# Patient Record
Sex: Female | Born: 2000 | Race: Black or African American | Hispanic: No | Marital: Single | Smoking: Never smoker
Health system: Southern US, Community
[De-identification: ages and names within clinical notes are randomized; demographics above are authoritative.]

---
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
## 2018-01-16 ENCOUNTER — Encounter (HOSPITAL_BASED_OUTPATIENT_CLINIC_OR_DEPARTMENT_OTHER): Payer: Self-pay

## 2018-01-16 ENCOUNTER — Emergency Department (HOSPITAL_BASED_OUTPATIENT_CLINIC_OR_DEPARTMENT_OTHER): Payer: 59

## 2018-01-16 ENCOUNTER — Other Ambulatory Visit: Payer: Self-pay

## 2018-01-16 ENCOUNTER — Emergency Department (HOSPITAL_BASED_OUTPATIENT_CLINIC_OR_DEPARTMENT_OTHER)
Admission: EM | Admit: 2018-01-16 | Discharge: 2018-01-16 | Disposition: A | Discharge status: Home / Self Care | Payer: 59 | Attending: Emergency Medicine | Admitting: Emergency Medicine

## 2018-01-16 DIAGNOSIS — B2790 Infectious mononucleosis, unspecified without complication: Secondary | ICD-10-CM | POA: Diagnosis not present

## 2018-01-16 DIAGNOSIS — R52 Pain, unspecified: Secondary | ICD-10-CM | POA: Diagnosis present

## 2018-01-16 LAB — CBC WITH DIFFERENTIAL/PLATELET
Abs Immature Granulocytes: 0.04 10*3/uL (ref 0.00–0.07)
Basophils Absolute: 0.1 10*3/uL (ref 0.0–0.1)
Basophils Relative: 1 %
EOS PCT: 2 %
Eosinophils Absolute: 0.1 10*3/uL (ref 0.0–0.5)
HEMATOCRIT: 31.7 % — AB (ref 36.0–46.0)
HEMOGLOBIN: 10 g/dL — AB (ref 12.0–15.0)
Immature Granulocytes: 1 %
LYMPHS ABS: 3 10*3/uL (ref 0.7–4.0)
LYMPHS PCT: 40 %
MCH: 27.9 pg (ref 26.0–34.0)
MCHC: 31.5 g/dL (ref 30.0–36.0)
MCV: 88.3 fL (ref 80.0–100.0)
Monocytes Absolute: 0.5 10*3/uL (ref 0.1–1.0)
Monocytes Relative: 7 %
NEUTROS PCT: 49 %
NRBC: 0 % (ref 0.0–0.2)
Neutro Abs: 3.7 10*3/uL (ref 1.7–7.7)
Platelets: 217 10*3/uL (ref 150–400)
RBC: 3.59 MIL/uL — ABNORMAL LOW (ref 3.87–5.11)
RDW: 13.5 % (ref 11.5–15.5)
WBC: 7.4 10*3/uL (ref 4.0–10.5)

## 2018-01-16 LAB — BASIC METABOLIC PANEL
Anion gap: 7 (ref 5–15)
BUN: 10 mg/dL (ref 6–20)
CO2: 23 mmol/L (ref 22–32)
Calcium: 9 mg/dL (ref 8.9–10.3)
Chloride: 105 mmol/L (ref 98–111)
Creatinine, Ser: 0.73 mg/dL (ref 0.44–1.00)
GFR calc Af Amer: >60 mL/min (ref >60)
GFR calc non Af Amer: >60 mL/min (ref >60)
GLUCOSE: 106 mg/dL — AB (ref 70–99)
POTASSIUM: 3.7 mmol/L (ref 3.5–5.1)
Sodium: 135 mmol/L (ref 135–145)

## 2018-01-16 LAB — URINALYSIS, ROUTINE W REFLEX MICROSCOPIC
Bilirubin Urine: NEGATIVE
GLUCOSE, UA: NEGATIVE mg/dL
Ketones, ur: NEGATIVE mg/dL
Nitrite: NEGATIVE
Protein, ur: NEGATIVE mg/dL
pH: 6.5 (ref 5.0–8.0)

## 2018-01-16 LAB — PREGNANCY, URINE: Preg Test, Ur: NEGATIVE

## 2018-01-16 LAB — URINALYSIS, MICROSCOPIC (REFLEX): RBC / HPF: >50 RBC/hpf (ref 0–5)

## 2018-01-16 LAB — TSH: TSH: 0.909 u[IU]/mL (ref 0.350–4.500)

## 2018-01-16 LAB — MONONUCLEOSIS SCREEN: Mono Screen: POSITIVE — AB

## 2018-01-16 NOTE — ED Provider Notes (Signed)
[facility] EMERGENCY DEPARTMENT Provider Note   CSN: [id number] Arrival date & time: 01/16/18  0932     History   Chief Complaint Chief Complaint  Patient presents with  . Generalized Body Aches    HPI Mary Wheeler is a 17 y.o. female.  HPI Patient presents with weakness and fatigue.  Has had over the last week but has been worse over the last around 4 days.  Has had generalized weakness and had a near syncopal episode today.  Will feel hot at times.  No sore throat.  No cough.  No dysuria.  Had URI symptoms around a month ago but that resolved.  No weight loss.  Has had decreased appetite.  No sick contacts.  Went to student health on Monday was told she may have a virus.  Has had episodes where she is felt dizzy in the past but never prolonged fatigue and lightheadedness like this. History reviewed. No pertinent past medical history.  There are no active problems to display for this patient.   History reviewed. No pertinent surgical history.   OB History   None      Home Medications    Prior to Admission medications   Not on File    Family History No family history on file.  Social History Social History   Tobacco Use  . Smoking status: Never Smoker  Substance Use Topics  . Alcohol use: Yes  . Drug use: Not Currently     Allergies   Patient has no known allergies.   Review of Systems Review of Systems  Constitutional: Positive for appetite change and fatigue. Negative for fever.  HENT: Negative for congestion.   Respiratory: Negative for shortness of breath.   Cardiovascular: Negative for chest pain.  Gastrointestinal: Negative for abdominal pain.  Genitourinary: Negative for flank pain.  Musculoskeletal: Negative for back pain.  Skin: Negative for wound.  Neurological: Positive for weakness.  Hematological: Negative for adenopathy.  Psychiatric/Behavioral: Negative for confusion.     Physical Exam Updated Vital Signs BP  118/75 (BP Location: Left Arm)   Pulse 80   Temp 99.4 F (37.4 C) (Oral)   Resp 18   Ht 5\' 5"  (1.651 m)   Wt 76.7 kg   LMP 01/13/2018   SpO2 100%   BMI 28.12 kg/m   Physical Exam  Constitutional: She appears well-developed.  HENT:  Head: Atraumatic.  Mouth/Throat: No oropharyngeal exudate.  Eyes: Pupils are equal, round, and reactive to light.  Neck: Neck supple. No thyromegaly present.  Cardiovascular: Normal rate.  Pulmonary/Chest: She has no wheezes. She has no rales.  Abdominal: There is no tenderness.  Slight lower abdominal fullness.  Musculoskeletal: She exhibits no edema.  Neurological: She is alert.  Skin: Skin is warm.     ED Treatments / Results  Labs (all labs ordered are listed, but only abnormal results are displayed) Labs Reviewed  CBC WITH DIFFERENTIAL/PLATELET - Abnormal; Notable for the following components:      Result Value   RBC 3.59 (*)    Hemoglobin 10.0 (*)    HCT 31.7 (*)    All other components within normal limits  URINALYSIS, ROUTINE W REFLEX MICROSCOPIC - Abnormal; Notable for the following components:   Color, Urine RED (*)    APPearance CLOUDY (*)    Specific Gravity, Urine <1.005 (*)    Hgb urine dipstick LARGE (*)    Leukocytes, UA TRACE (*)    All other components within normal limits  BASIC METABOLIC PANEL - Abnormal; Notable for the following components:   Glucose, Bld 106 (*)    All other components within normal limits  MONONUCLEOSIS SCREEN - Abnormal; Notable for the following components:   Mono Screen POSITIVE (*)    All other components within normal limits  URINALYSIS, MICROSCOPIC (REFLEX) - Abnormal; Notable for the following components:   Bacteria, UA RARE (*)    All other components within normal limits  PREGNANCY, URINE  TSH  PATHOLOGIST SMEAR REVIEW    EKG EKG Interpretation  Date/Time:  Wednesday January 16 2018 09:34:36 EST Ventricular Rate:  85 PR Interval:    QRS Duration: 97 QT Interval:  353 QTC  Calculation: 420 R Axis:   -12 Text Interpretation:  Sinus rhythm Confirmed by Davonna Belling [phone]) on 01/16/2018 9:59:38 AM   Radiology Dg Chest 2 View  Result Date: 01/16/2018 CLINICAL DATA:  Weakness, shortness of breath EXAM: CHEST - 2 VIEW COMPARISON:  None. FINDINGS: Heart and mediastinal contours are within normal limits. No focal opacities or effusions. No acute bony abnormality. IMPRESSION: No active cardiopulmonary disease. Electronically Signed   By: Rolm Baptise M.D.   On: 01/16/2018 09:23    Procedures Procedures (including critical care time)  Medications Ordered in ED Medications - No data to display   Initial Impression / Assessment and Plan / ED Course  I have reviewed the triage vital signs and the nursing notes.  Pertinent labs & imaging results that were available during my care of the patient were reviewed by me and considered in my medical decision making (see chart for details).     Patient has fatigue.  Has had some chills.  Mild anemia.  Also has a positive mononucleosis screen.  No left upper quadrant tenderness or swelling.  TSH pending.  Will discharge home.  Final Clinical Impressions(s) / ED Diagnoses   Final diagnoses:  Infectious mononucleosis without complication, infectious mononucleosis due to unspecified organism    ED Discharge Orders    None       Davonna Belling, MD 01/16/18 1124

## 2018-01-16 NOTE — ED Triage Notes (Signed)
Pt states aching all over since Saturday feeling hot

## 2018-01-16 NOTE — ED Notes (Signed)
Pt states not feeling well since Saturday, hot and cold feeling with body aches. States has not tried OTC meds

## 2018-01-18 LAB — PATHOLOGIST SMEAR REVIEW

## 2018-01-23 ENCOUNTER — Emergency Department (HOSPITAL_BASED_OUTPATIENT_CLINIC_OR_DEPARTMENT_OTHER)
Admission: EM | Admit: 2018-01-23 | Discharge: 2018-01-23 | Disposition: A | Discharge status: Home / Self Care | Payer: 59 | Attending: Emergency Medicine | Admitting: Emergency Medicine

## 2018-01-23 ENCOUNTER — Encounter (HOSPITAL_BASED_OUTPATIENT_CLINIC_OR_DEPARTMENT_OTHER): Payer: Self-pay

## 2018-01-23 ENCOUNTER — Other Ambulatory Visit: Payer: Self-pay

## 2018-01-23 ENCOUNTER — Emergency Department (HOSPITAL_BASED_OUTPATIENT_CLINIC_OR_DEPARTMENT_OTHER): Payer: 59

## 2018-01-23 DIAGNOSIS — J029 Acute pharyngitis, unspecified: Secondary | ICD-10-CM | POA: Diagnosis present

## 2018-01-23 DIAGNOSIS — J36 Peritonsillar abscess: Secondary | ICD-10-CM | POA: Diagnosis not present

## 2018-01-23 DIAGNOSIS — D649 Anemia, unspecified: Secondary | ICD-10-CM | POA: Insufficient documentation

## 2018-01-23 DIAGNOSIS — B2790 Infectious mononucleosis, unspecified without complication: Secondary | ICD-10-CM | POA: Insufficient documentation

## 2018-01-23 LAB — CBC WITH DIFFERENTIAL/PLATELET
Abs Immature Granulocytes: 0.12 10*3/uL — ABNORMAL HIGH (ref 0.00–0.07)
BASOS ABS: 0.1 10*3/uL (ref 0.0–0.1)
BASOS PCT: 1 %
Eosinophils Absolute: 0 10*3/uL (ref 0.0–0.5)
Eosinophils Relative: 0 %
HCT: 31.6 % — ABNORMAL LOW (ref 36.0–46.0)
Hemoglobin: 9.7 g/dL — ABNORMAL LOW (ref 12.0–15.0)
IMMATURE GRANULOCYTES: 1 %
Lymphocytes Relative: 49 %
Lymphs Abs: 9.4 10*3/uL — ABNORMAL HIGH (ref 0.7–4.0)
MCH: 26.9 pg (ref 26.0–34.0)
MCHC: 30.7 g/dL (ref 30.0–36.0)
MCV: 87.5 fL (ref 80.0–100.0)
MONOS PCT: 17 %
Monocytes Absolute: 3.3 10*3/uL — ABNORMAL HIGH (ref 0.1–1.0)
NEUTROS ABS: 6.1 10*3/uL (ref 1.7–7.7)
NEUTROS PCT: 32 %
PLATELETS: 334 10*3/uL (ref 150–400)
RBC: 3.61 MIL/uL — ABNORMAL LOW (ref 3.87–5.11)
RDW: 14.5 % (ref 11.5–15.5)
WBC MORPHOLOGY: REACTIVE
WBC: 19 10*3/uL — ABNORMAL HIGH (ref 4.0–10.5)
nRBC: 0 % (ref 0.0–0.2)

## 2018-01-23 LAB — BASIC METABOLIC PANEL
ANION GAP: 12 (ref 5–15)
BUN: 11 mg/dL (ref 6–20)
CO2: 23 mmol/L (ref 22–32)
Calcium: 8.6 mg/dL — ABNORMAL LOW (ref 8.9–10.3)
Chloride: 95 mmol/L — ABNORMAL LOW (ref 98–111)
Creatinine, Ser: 0.77 mg/dL (ref 0.44–1.00)
GFR calc Af Amer: >60 mL/min (ref >60)
GLUCOSE: 98 mg/dL (ref 70–99)
POTASSIUM: 3.8 mmol/L (ref 3.5–5.1)
Sodium: 130 mmol/L — ABNORMAL LOW (ref 135–145)

## 2018-01-23 LAB — GROUP A STREP BY PCR: GROUP A STREP BY PCR: NOT DETECTED

## 2018-01-23 MED ORDER — SODIUM CHLORIDE 0.9 % IV BOLUS
1000.0000 mL | Freq: Once | INTRAVENOUS | Status: AC
  Administered 2018-01-23: 1000 mL via INTRAVENOUS

## 2018-01-23 MED ORDER — IBUPROFEN 100 MG/5ML PO SUSP
600.0000 mg | Freq: Once | ORAL | Status: AC
  Administered 2018-01-23: 600 mg via ORAL
  Filled 2018-01-23: qty 30

## 2018-01-23 MED ORDER — PREDNISOLONE 15 MG/5ML PO SOLN: 60.0000 mg | Freq: Every day | ORAL | 0 refills | Status: AC

## 2018-01-23 MED ORDER — CLINDAMYCIN PALMITATE HCL 75 MG/5ML PO SOLR: 300.0000 mg | Freq: Three times a day (TID) | ORAL | 0 refills | Status: AC

## 2018-01-23 MED ORDER — ACETAMINOPHEN 160 MG/5ML PO SOLN
500.0000 mg | Freq: Once | ORAL | Status: AC
  Administered 2018-01-23: 500 mg via ORAL
  Filled 2018-01-23: qty 20.3

## 2018-01-23 MED ORDER — ACETAMINOPHEN 500 MG PO TABS
1000.0000 mg | ORAL_TABLET | Freq: Once | ORAL | Status: DC
  Filled 2018-01-23: qty 2

## 2018-01-23 MED ORDER — CLINDAMYCIN PHOSPHATE 900 MG/50ML IV SOLN
900.0000 mg | Freq: Once | INTRAVENOUS | Status: AC
  Administered 2018-01-23: 900 mg via INTRAVENOUS
  Filled 2018-01-23: qty 50

## 2018-01-23 MED ORDER — DEXAMETHASONE SODIUM PHOSPHATE 10 MG/ML IJ SOLN
10.0000 mg | Freq: Once | INTRAMUSCULAR | Status: AC
  Administered 2018-01-23: 10 mg via INTRAVENOUS
  Filled 2018-01-23: qty 1

## 2018-01-23 MED ORDER — IOPAMIDOL (ISOVUE-300) INJECTION 61%
100.0000 mL | Freq: Once | INTRAVENOUS | Status: AC | PRN
  Administered 2018-01-23: 75 mL via INTRAVENOUS

## 2018-01-23 NOTE — ED Provider Notes (Signed)
[facility] Provider Note   CSN: [id number] Arrival date & time: 01/23/18  1518     History   Chief Complaint Sore throat  HPI Mary Wheeler is a 17 y.o. female without significant past medical hx who presents to the ED with complaints of sore throat x 3 days. Patient states she developed sore throat with associated bilateral tonsillar 3 days prior and it has been progressively worsening. Current pain is a 10/10 in severity, it is worse with swallowing and she feels she is not able to swallow anything, she prefers to spit out her saliva. She has not taken medicines for this for this reason. She has had congestion and cough as well. States with all of her symptoms she feels short of breath. She was seen in the ED 11/27 for generalized weakness and fatigue, labs w/ anemia w/ hgb/hct 10.0/31.7. Mononucleosis test positive- was not having sore throat at that time. States since last visit low grade temps at home in the 99 range, today spiked above 100. Patient denies N/V/D, chest pain, or abdominal pain.   HPI  History reviewed. No pertinent past medical history.  There are no active problems to display for this patient.   History reviewed. No pertinent surgical history.   OB History   None      Home Medications    Prior to Admission medications   Not on File    Family History No family history on file.  Social History Social History   Tobacco Use  . Smoking status: Never Smoker  . Smokeless tobacco: Never Used  Substance Use Topics  . Alcohol use: Yes    Comment: occ  . Drug use: Not Currently     Allergies   Patient has no known allergies.   Review of Systems Review of Systems  Constitutional: Positive for chills and fever.  HENT: Positive for congestion, rhinorrhea, sore throat and trouble swallowing. Negative for ear pain.   Respiratory: Positive for cough and shortness of breath.   Cardiovascular: Negative for chest pain.    Gastrointestinal: Negative for abdominal pain and vomiting.  All other systems reviewed and are negative.    Physical Exam Updated Vital Signs BP 120/81 (BP Location: Left Arm)   Pulse (!) 117   Temp (!) 101.8 F (38.8 C) (Oral)   Resp (!) 28   Ht 5\' 5"  (1.651 m)   Wt 74.4 kg   LMP 01/13/2018   SpO2 100%   BMI 27.29 kg/m   Physical Exam  Constitutional: She appears well-developed and well-nourished.  Non-toxic appearance. No distress.  HENT:  Head: Normocephalic and atraumatic.  Right Ear: Tympanic membrane and ear canal normal. Tympanic membrane is not perforated, not erythematous, not retracted and not bulging.  Left Ear: Tympanic membrane and ear canal normal. Tympanic membrane is not perforated, not erythematous, not retracted and not bulging.  Nose: Mucosal edema present.  Mouth/Throat: Uvula is midline. Oropharyngeal exudate and posterior oropharyngeal erythema present. Tonsils are 4+ on the right. Tonsils are 4+ on the left. Tonsillar exudate.  Patient avoiding swallowing her own secretions, spitting into bag at bedside. Voice may be very mildly muffled, but no hot potato voice. No trismus. No drooling. Submandibular space is soft.   Eyes: Pupils are equal, round, and reactive to light. Conjunctivae and EOM are normal. Right eye exhibits no discharge. Left eye exhibits no discharge.  Neck: Normal range of motion. Neck supple. No neck rigidity.  Cardiovascular: Regular rhythm. Tachycardia present.  No murmur heard. Pulmonary/Chest: Breath sounds normal. No accessory muscle usage or stridor. Tachypnea noted. No respiratory distress. She has no wheezes. She has no rhonchi. She has no rales.  Abdominal: Soft. She exhibits no distension and no mass. There is no splenomegaly or hepatomegaly. There is no tenderness. There is no rebound and no guarding.  Lymphadenopathy:    She has cervical adenopathy.  Neurological: She is alert.  Skin: Skin is warm and dry. No rash noted.   Psychiatric: She has a normal mood and affect. Her behavior is normal.  Nursing note and vitals reviewed.    ED Treatments / Results  Labs (all labs ordered are listed, but only abnormal results are displayed) Labs Reviewed  BASIC METABOLIC PANEL - Abnormal; Notable for the following components:      Result Value   Sodium 130 (*)    Chloride 95 (*)    Calcium 8.6 (*)    All other components within normal limits  CBC WITH DIFFERENTIAL/PLATELET - Abnormal; Notable for the following components:   WBC 19.0 (*)    RBC 3.61 (*)    Hemoglobin 9.7 (*)    HCT 31.6 (*)    Lymphs Abs 9.4 (*)    Monocytes Absolute 3.3 (*)    Abs Immature Granulocytes 0.12 (*)    All other components within normal limits  GROUP A STREP BY PCR  PATHOLOGIST SMEAR REVIEW    EKG None  Radiology Ct Soft Tissue Neck W Contrast  Result Date: 01/23/2018 CLINICAL DATA:  Shortness of breath, swollen tonsils, diagnosed with mono. EXAM: CT NECK WITH CONTRAST TECHNIQUE: Multidetector CT imaging of the neck was performed using the standard protocol following the bolus administration of intravenous contrast. CONTRAST:  42mL ISOVUE-300 IOPAMIDOL (ISOVUE-300) INJECTION 61% COMPARISON:  None. FINDINGS: Pharynx and larynx: Severe nasopharyngeal adenoidal inflammation, striated enhancement pattern. Severe BILATERAL tonsillitis, RIGHT greater than LEFT. 5 mm RIGHT tonsillar microabscess. Inflammation extends into the peritonsillar soft tissues, RIGHT greater than LEFT, without frank peritonsillar abscess. Swollen uvula. Normal epiglottis. Normal larynx. Salivary glands: No inflammation, mass, or stone. Thyroid: Normal. Lymph nodes: Reactive cervical adenopathy bilaterally. Vascular: Patent Limited intracranial: Negative Visualized orbits: Negative Mastoids and visualized paranasal sinuses: Negative Skeleton: Unremarkable. Upper chest: No abnormality. Other: None. IMPRESSION: Severe palatine tonsillar enlargement, small RIGHT  tonsillar abscess, developing RIGHT greater than LEFT peritonsillar inflammation, and severe nasopharyngeal adenoidal inflammation. In conjunction with uvular enlargement, there is some narrowing of the upper airway. Reactive cervical lymphadenopathy. Consider ENT consultation. These results were called by telephone at the time of interpretation on 01/23/2018 at 4:44 pm to [facility] , who verbally acknowledged these results. Electronically Signed   By: Staci Righter M.D.   On: 01/23/2018 16:49    Procedures Procedures (including critical care time)  Medications Ordered in ED Medications - No data to display   Initial Impression / Assessment and Plan / ED Course  I have reviewed the triage vital signs and the nursing notes.  Pertinent labs & imaging results that were available during my care of the patient were reviewed by me and considered in my medical decision making (see chart for details).   Patient with confirmed mononucleosis at last ED visit presents with progressively worsening sore throat with tonsillar swelling over the past 3 days.  Patient states this has led to feeling as though she cannot swallow with some difficulty breathing.  Febrile upon ER arrival with likely resultant tachycardia and tachypnea.  Exam with significant tonsillar swelling, tonsils are  touching, uvula appears enlarged, there are exudates bilaterally.  She is spitting her saliva into a bag and her voice is mildly muffled but not necessarily hot potato voice.  She is not stridorous. Lungs are clear to auscultation bilaterally. No palpable hepatosplenomegaly. Basic labs obtained, patient has leukocytosis at 19.0, elevated lymphocytes, consistent with mononucleosis. Anemia present, similar to prior- discuss need for PCP recheck. She has mildly low sodium and chloride she is receiving fluids in the emergency department.  Strep test is negative.  CT soft tissue neck has severe palatine tonsillar enlargement with  small right tonsillar abscess.  There is developing peritonsillar inflammation as well as severe nasopharyngeal adenoidal inflammation.  Some narrowing of the upper airway present.  Radiologist Dr. Jola Baptist discussed results with me via telephone and recommends ENT consultation.  17:06: CONSULT: Discussed case with Dr. Radene Journey, ENT, recommends IV decadron and 900 mg of IV clindamycin in the ED. Discharge home with steroids as well as clindamycin (300 TID) or augmentin (875 BID), each of which should be oral suspensions. Can follow up in office tomorrow afternoon.   19:30: RE-EVAL: Patient feeling much better after ER intervention. Her vitals have normalized. She is tolerating PO. Feel she is safe for discharge at this time with close follow up & strict return precautions. I discussed results, treatment plan, need for follow-up, and return precautions with the patient and her mother. Provided opportunity for questions, patient and her mother confirmed understanding and are in agreement with plan.   Vitals:   01/23/18 1900 01/23/18 1930  BP: 111/69 109/69  Pulse: 99 87  Resp:  16  Temp:  98.1 F (36.7 C)  SpO2: 99% 97%    Findings and plan of care discussed with supervising physician Dr. Laverta Baltimore who personally evaluated and examined this patient and is in agreement.   Final Clinical Impressions(s) / ED Diagnoses   Final diagnoses:  Acute pharyngitis due to infectious mononucleosis  Anemia, unspecified type  Tonsillar abscess    ED Discharge Orders         Ordered    clindamycin (CLEOCIN) 75 MG/5ML solution  3 times daily     01/23/18 1923    prednisoLONE (PRELONE) 15 MG/5ML SOLN  Daily before breakfast     01/23/18 1923           Leafy Kindle 01/23/18 1949    Margette Fast, MD 01/24/18 1131

## 2018-01-23 NOTE — Discharge Instructions (Signed)
You were seen in the emergency department today for sore throat and trouble breathing.  Your CT scan showed that you have severe tonsillitis and swelling throughout your lymph nodes.  We are sending you home with steroids to help with inflammation and pain as well as clindamycin, and antibiotic to help with the infection.  Please take these as prescribed.  Please continue to take Motrin/Tylenol per over-the-counter dosing for any fevers.  We have prescribed you new medication(s) today. Discuss the medications prescribed today with your pharmacist as they can have adverse effects and interactions with your other medicines including over the counter and prescribed medications. Seek medical evaluation if you start to experience new or abnormal symptoms after taking one of these medicines, seek care immediately if you start to experience difficulty breathing, feeling of your throat closing, facial swelling, or rash as these could be indications of a more serious allergic reaction  We would like you to follow-up closely with the ear nose and throat doctor, Dr. Lucia Gaskins, provided in your discharge instructions.  Please call tomorrow morning to set up an appointment for tomorrow afternoon.  Return to the ER at any time should you experience new or worsening symptoms including but not limited to inability to swallow, change in your voice, difficulty breathing, worsened pain or any other concerns.

## 2018-01-23 NOTE — ED Triage Notes (Signed)
Pt c/o SOB, swollen tonsils-was dx with mono last week-NAD-steady gait

## 2018-01-24 LAB — PATHOLOGIST SMEAR REVIEW: Path Review: REACTIVE

## 2019-02-18 ENCOUNTER — Ambulatory Visit: Payer: Self-pay | Attending: Internal Medicine

## 2019-02-19 ENCOUNTER — Ambulatory Visit: Payer: Self-pay | Attending: Internal Medicine

## 2019-02-19 DIAGNOSIS — Z20828 Contact with and (suspected) exposure to other viral communicable diseases: Secondary | ICD-10-CM | POA: Insufficient documentation

## 2019-02-19 DIAGNOSIS — Z20822 Contact with and (suspected) exposure to covid-19: Secondary | ICD-10-CM

## 2019-02-20 LAB — NOVEL CORONAVIRUS, NAA: SARS-CoV-2, NAA: NOT DETECTED

## 2019-04-28 ENCOUNTER — Ambulatory Visit: Payer: Self-pay | Attending: Internal Medicine

## 2019-04-28 DIAGNOSIS — Z23 Encounter for immunization: Secondary | ICD-10-CM | POA: Insufficient documentation

## 2019-04-28 NOTE — Progress Notes (Signed)
   Covid-19 Vaccination Clinic  Name:  Mary Wheeler    MRN: [id number] DOB: 18-Jan-2001  04/28/2019  [name] was observed post Covid-19 immunization for 15 minutes without incident. She was provided with Vaccine Information Sheet and instruction to access the V-Safe system.   [name] was instructed to call 911 with any severe reactions post vaccine: Marland Kitchen Difficulty breathing  . Swelling of face and throat  . A fast heartbeat  . A bad rash all over body  . Dizziness and weakness   Immunizations Administered    Name Date Dose VIS Date Route   Pfizer COVID-19 Vaccine 04/28/2019  9:10 AM 0.3 mL 01/31/2019 Intramuscular   Manufacturer: [place]   Lot: [id number]   [place]: [id number]

## 2019-05-28 ENCOUNTER — Ambulatory Visit: Payer: Self-pay | Attending: Internal Medicine

## 2019-05-28 DIAGNOSIS — Z23 Encounter for immunization: Secondary | ICD-10-CM

## 2019-05-28 NOTE — Progress Notes (Signed)
   Covid-19 Vaccination Clinic  Name:  Mary Wheeler    MRN: [id number] DOB: May 20, 2000  05/28/2019  Ms. Current was observed post Covid-19 immunization for 15 minutes without incident. She was provided with Vaccine Information Sheet and instruction to access the V-Safe system.   [name] was instructed to call 911 with any severe reactions post vaccine: Marland Kitchen Difficulty breathing  . Swelling of face and throat  . A fast heartbeat  . A bad rash all over body  . Dizziness and weakness   Immunizations Administered    Name Date Dose VIS Date Route   Pfizer COVID-19 Vaccine 05/28/2019 10:42 AM 0.3 mL 01/31/2019 Intramuscular   Manufacturer: [employer]   Lot: [id number]   [place]: [id number]

## 2019-12-26 ENCOUNTER — Other Ambulatory Visit: Payer: Self-pay

## 2019-12-26 DIAGNOSIS — Z20822 Contact with and (suspected) exposure to covid-19: Secondary | ICD-10-CM

## 2019-12-27 LAB — SARS-COV-2, NAA 2 DAY TAT

## 2019-12-27 LAB — NOVEL CORONAVIRUS, NAA: SARS-CoV-2, NAA: NOT DETECTED

## 2020-02-27 ENCOUNTER — Ambulatory Visit: Payer: Self-pay | Attending: Internal Medicine

## 2020-02-27 DIAGNOSIS — Z23 Encounter for immunization: Secondary | ICD-10-CM

## 2020-02-27 NOTE — Progress Notes (Signed)
   Covid-19 Vaccination Clinic  Name:  Mary Wheeler    MRN: [id number] DOB: 08-11-2000  02/27/2020  [name] was observed post Covid-19 immunization for 15 minutes without incident. She was provided with Vaccine Information Sheet and instruction to access the V-Safe system.   [name] was instructed to call 911 with any severe reactions post vaccine: Marland Kitchen Difficulty breathing  . Swelling of face and throat  . A fast heartbeat  . A bad rash all over body  . Dizziness and weakness   Immunizations Administered    Name Date Dose VIS Date Route   Pfizer COVID-19 Vaccine 02/27/2020  1:50 PM 0.3 mL 12/10/2019 Intramuscular   Manufacturer: [place]   Lot: [id number]   [place]: [id number]

## 2020-09-14 IMAGING — CT CT NECK W/ CM
3 of 5 series · 12 of 33 positions shown, 14 images · IV contrast (iopamidol)
Comparison: None.

CLINICAL DATA: Shortness of breath, swollen tonsils, diagnosed with
mono.

EXAM:
CT NECK WITH CONTRAST
TECHNIQUE: Multidetector CT imaging of the neck was performed using the
standard protocol following the bolus administration of intravenous
contrast.
CONTRAST:  75mL PQ2BBR-JPP IOPAMIDOL (PQ2BBR-JPP) INJECTION 61%

[Series 7: sag neck · sagittal · 0.45mm/px · 5 of 117 slices shown, 6 images]
[im 39/117  bone]
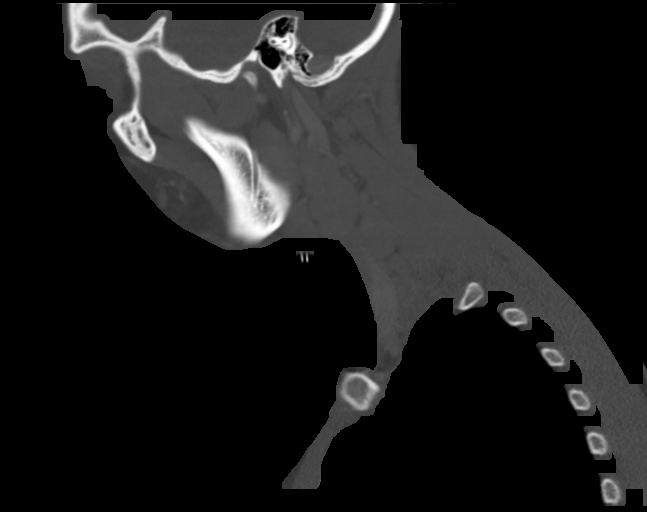
[im 49/117  bone]
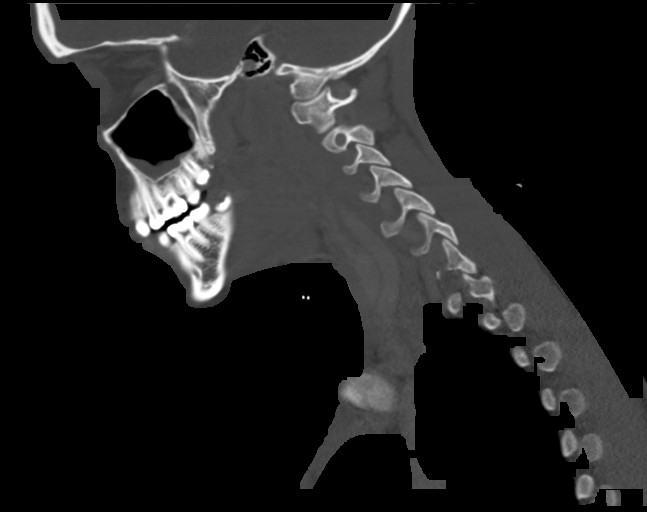
[im 59/117  soft-tissue]
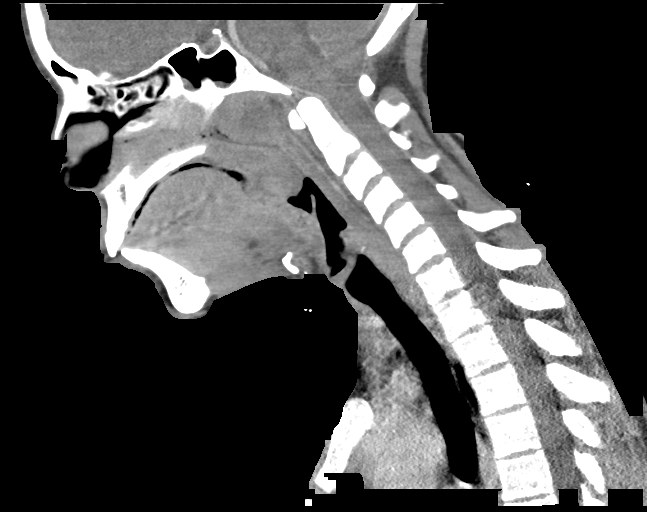
[im 59/117  bone]
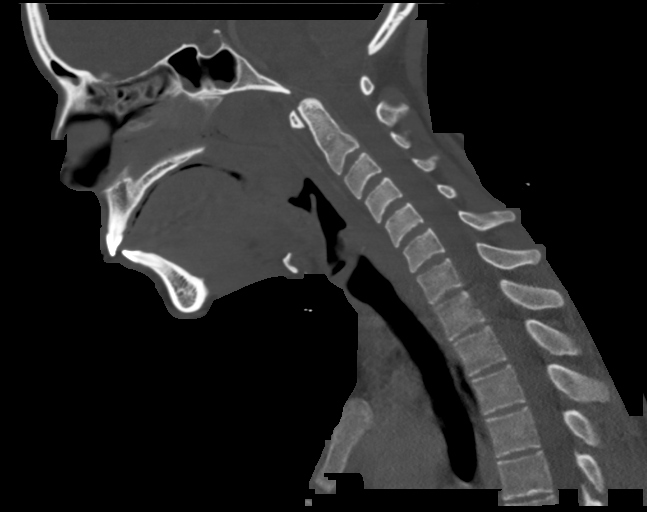
[im 68/117  bone]
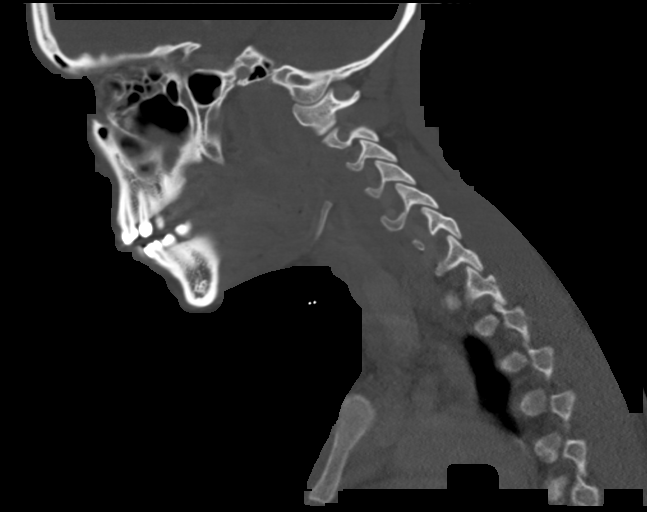
[im 78/117  bone]
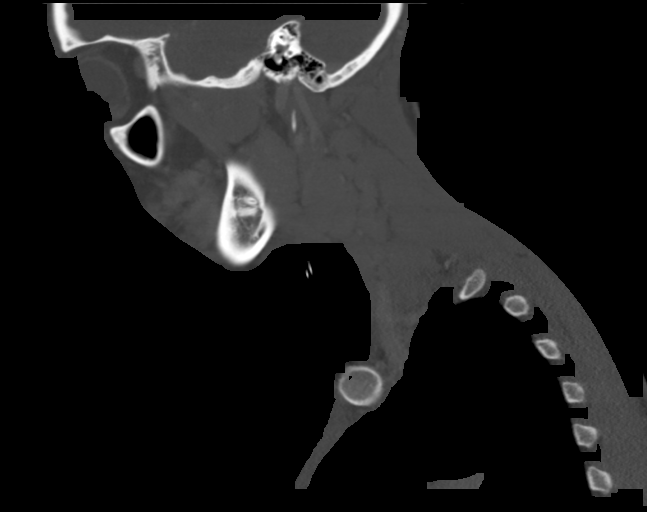

[Series 8: cor neck · coronal · 0.49mm/px · 3 of 122 slices shown]
[im 27/122  bone]
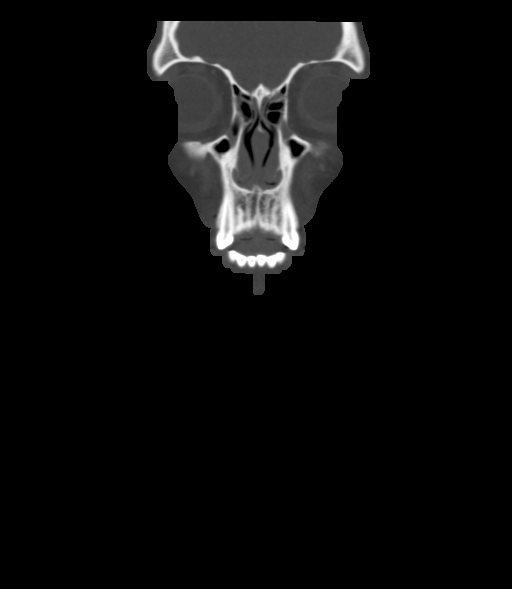
[im 50/122  bone]
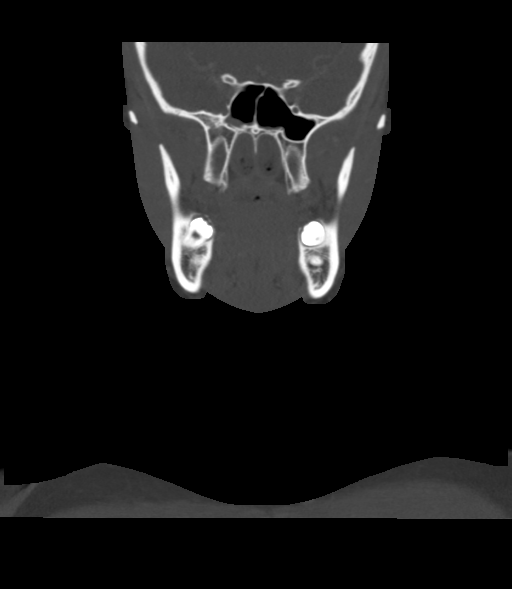
[im 72/122  bone]
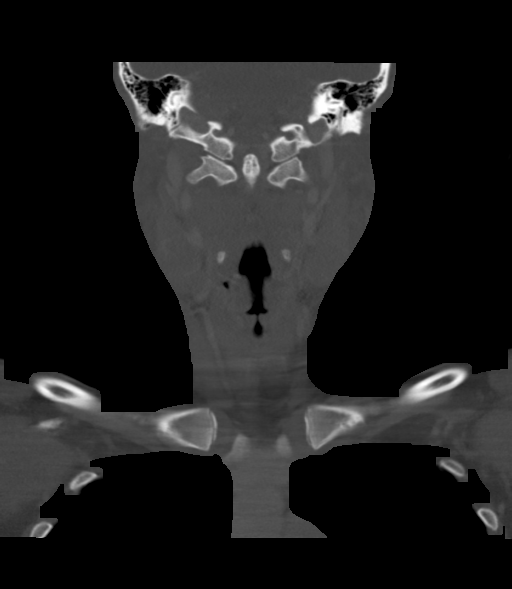

[Series 9: orthogonal ax · axial · 0.39mm/px · z∈[+753,+939]mm · 4 of 146 slices shown, 5 images]
[im 25/146  soft-tissue]
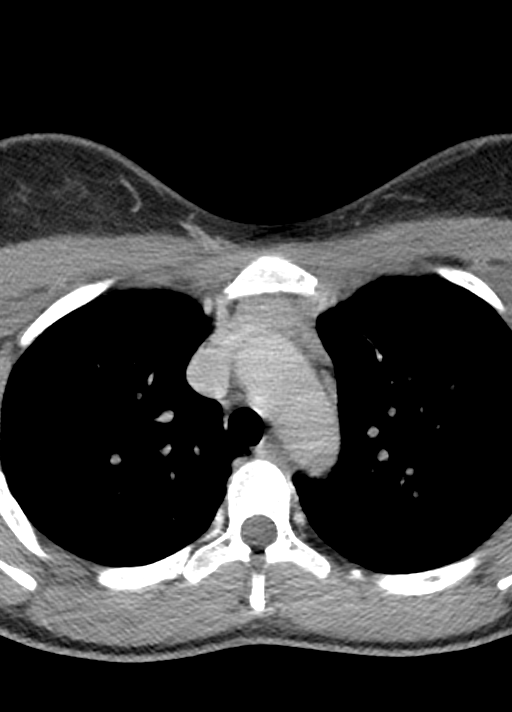
[im 25/146  bone]
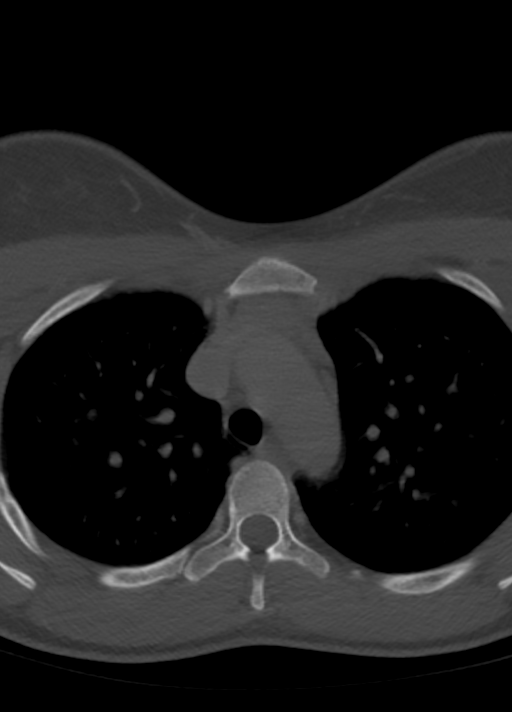
[im 49/146  bone]
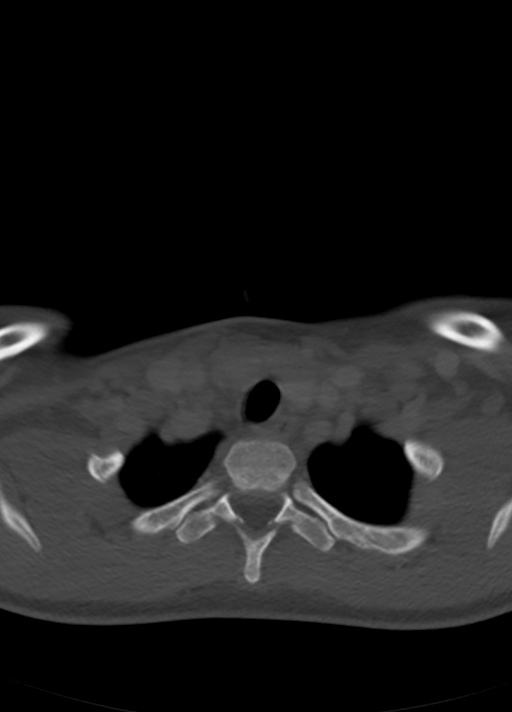
[im 97/146  bone]
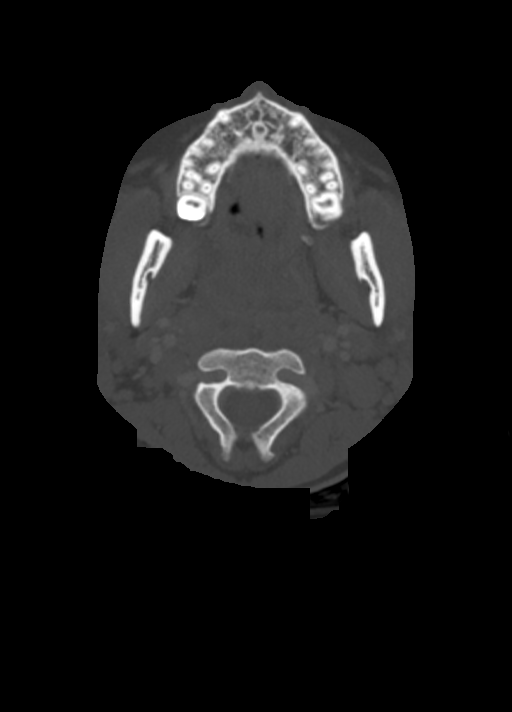
[im 121/146  bone]
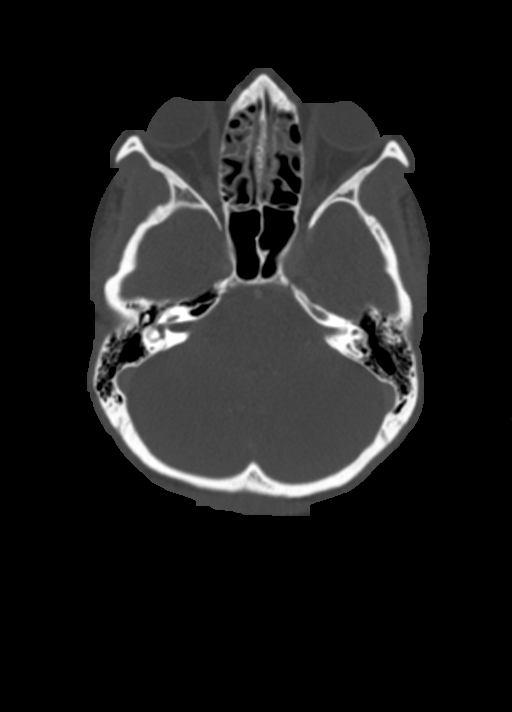

[12 of 33 positions shown; findings below may reference images not displayed]

FINDINGS: Pharynx and larynx: Severe nasopharyngeal adenoidal inflammation,
striated enhancement pattern. Severe BILATERAL tonsillitis, RIGHT
greater than LEFT. 5 mm RIGHT tonsillar microabscess. Inflammation
extends into the peritonsillar soft tissues, RIGHT greater than
LEFT, without frank peritonsillar abscess. Swollen uvula. Normal
epiglottis. Normal larynx.

Salivary glands: No inflammation, mass, or stone.

Thyroid: Normal.

Lymph nodes: Reactive cervical adenopathy bilaterally.

Vascular: Patent

Limited intracranial: Negative

Visualized orbits: Negative

Mastoids and visualized paranasal sinuses: Negative

Skeleton: Unremarkable.

Upper chest: No abnormality.

Other: None.
IMPRESSION: Severe palatine tonsillar enlargement, small RIGHT tonsillar
abscess, developing RIGHT greater than LEFT peritonsillar
inflammation, and severe nasopharyngeal adenoidal inflammation. In
conjunction with uvular enlargement, there is some narrowing of the
upper airway. Reactive cervical lymphadenopathy. Consider ENT
consultation.

These results were called by telephone at the time of interpretation
on 01/23/2018 at [DATE] to LUZMILA MADERA , who verbally
acknowledged these results.

## 2022-10-06 ENCOUNTER — Encounter (HOSPITAL_COMMUNITY): Payer: Self-pay

## 2022-10-06 ENCOUNTER — Emergency Department (HOSPITAL_COMMUNITY)
Admission: EM | Admit: 2022-10-06 | Discharge: 2022-10-06 | Discharge status: Against Medical Advice | Payer: Self-pay | Source: Home / Self Care

## 2022-10-06 DIAGNOSIS — Y92410 Unspecified street and highway as the place of occurrence of the external cause: Secondary | ICD-10-CM | POA: Insufficient documentation

## 2022-10-06 DIAGNOSIS — R519 Headache, unspecified: Secondary | ICD-10-CM | POA: Insufficient documentation

## 2022-10-06 DIAGNOSIS — Z5321 Procedure and treatment not carried out due to patient leaving prior to being seen by health care provider: Secondary | ICD-10-CM | POA: Insufficient documentation
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
## 2022-10-06 NOTE — ED Notes (Signed)
Pt stated she had to leave, was unable to wait.

## 2022-10-07 ENCOUNTER — Encounter (HOSPITAL_COMMUNITY): Payer: Self-pay

## 2022-10-07 ENCOUNTER — Emergency Department (HOSPITAL_COMMUNITY)
Admission: EM | Admit: 2022-10-07 | Discharge: 2022-10-07 | Disposition: A | Discharge status: Home / Self Care | Payer: BC Managed Care – PPO | Source: Home / Self Care | Attending: Emergency Medicine | Admitting: Emergency Medicine

## 2022-10-07 ENCOUNTER — Emergency Department (HOSPITAL_COMMUNITY): Payer: BC Managed Care – PPO

## 2022-10-07 DIAGNOSIS — Y92410 Unspecified street and highway as the place of occurrence of the external cause: Secondary | ICD-10-CM | POA: Insufficient documentation

## 2022-10-07 DIAGNOSIS — R112 Nausea with vomiting, unspecified: Secondary | ICD-10-CM | POA: Diagnosis not present

## 2022-10-07 DIAGNOSIS — G44319 Acute post-traumatic headache, not intractable: Secondary | ICD-10-CM | POA: Diagnosis not present

## 2022-10-07 DIAGNOSIS — R519 Headache, unspecified: Secondary | ICD-10-CM | POA: Diagnosis present

## 2022-10-07 MED ORDER — ONDANSETRON 4 MG PO TBDP
4.0000 mg | ORAL_TABLET | Freq: Once | ORAL | Status: AC
  Administered 2022-10-07: 4 mg via ORAL
  Filled 2022-10-07: qty 1

## 2022-10-07 MED ORDER — ACETAMINOPHEN 500 MG PO TABS
1000.0000 mg | ORAL_TABLET | Freq: Once | ORAL | Status: AC
  Administered 2022-10-07: 1000 mg via ORAL
  Filled 2022-10-07: qty 2

## 2022-10-07 MED ORDER — ONDANSETRON 4 MG PO TBDP: 4.0000 mg | ORAL_TABLET | Freq: Three times a day (TID) | ORAL | 0 refills | Status: AC | PRN

## 2022-10-07 MED ORDER — METHOCARBAMOL 500 MG PO TABS: 1000.0000 mg | ORAL_TABLET | Freq: Four times a day (QID) | ORAL | 0 refills | Status: AC
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
